# Patient Record
Sex: Male | Born: 1990 | Race: White | Hispanic: No | Marital: Single | Smoking: Never smoker
Health system: Southern US, Community
[De-identification: ages and names within clinical notes are randomized; demographics above are authoritative.]

## PROBLEM LIST (undated history)

## (undated) DIAGNOSIS — J45909 Unspecified asthma, uncomplicated: Secondary | ICD-10-CM

## (undated) HISTORY — PX: WISDOM TOOTH EXTRACTION: SHX21

## (undated) HISTORY — DX: Unspecified asthma, uncomplicated: J45.909

---
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
## 2010-04-28 ENCOUNTER — Observation Stay (HOSPITAL_COMMUNITY): Admission: EM | Admit: 2010-04-28 | Discharge: 2010-04-29 | Payer: Self-pay | Admitting: Emergency Medicine

## 2010-10-13 LAB — BASIC METABOLIC PANEL
BUN: 7 mg/dL (ref 6–23)
CO2: 22 mEq/L (ref 19–32)
CO2: 25 mEq/L (ref 19–32)
Chloride: 105 mEq/L (ref 96–112)
Chloride: 108 mEq/L (ref 96–112)
GFR calc Af Amer: >60 mL/min (ref >60)
Potassium: 2.9 mEq/L — ABNORMAL LOW (ref 3.5–5.1)
Potassium: 4.3 mEq/L (ref 3.5–5.1)

## 2010-10-13 LAB — URINALYSIS, ROUTINE W REFLEX MICROSCOPIC
Glucose, UA: NEGATIVE mg/dL
Hgb urine dipstick: NEGATIVE
Ketones, ur: NEGATIVE mg/dL
pH: 6 (ref 5.0–8.0)

## 2010-10-13 LAB — DIFFERENTIAL
Eosinophils Relative: 2 % (ref 0–5)
Lymphocytes Relative: 36 % (ref 12–46)
Lymphs Abs: 3.9 10*3/uL (ref 0.7–4.0)
Monocytes Absolute: 0.6 10*3/uL (ref 0.1–1.0)
Monocytes Relative: 6 % (ref 3–12)

## 2010-10-13 LAB — CBC
HCT: 40.4 % (ref 39.0–52.0)
HCT: 42.3 % (ref 39.0–52.0)
Hemoglobin: 14.6 g/dL (ref 13.0–17.0)
MCH: 30.5 pg (ref 26.0–34.0)
MCV: 87.6 fL (ref 78.0–100.0)
MCV: 88.5 fL (ref 78.0–100.0)
Platelets: 235 10*3/uL (ref 150–400)
RBC: 4.61 MIL/uL (ref 4.22–5.81)
RBC: 4.78 MIL/uL (ref 4.22–5.81)
RDW: 12.1 % (ref 11.5–15.5)
WBC: 11.1 10*3/uL — ABNORMAL HIGH (ref 4.0–10.5)
WBC: 15.5 10*3/uL — ABNORMAL HIGH (ref 4.0–10.5)

## 2010-10-13 LAB — ETHANOL: Alcohol, Ethyl (B): 66 mg/dL — ABNORMAL HIGH (ref 0–10)

## 2011-02-02 IMAGING — CT CT EXTREM LOW W/O CM*R*
3 of 4 series · 15 of 35 positions shown, 19 images · non-contrast
Comparison: Plain films.

CLINICAL DATA: Level II trauma.  Leg pain.

CT RIGHT KNEE WITHOUT CONTRAST
TECHNIQUE: Multidetector CT imaging of the right knee was
performed according to the standard protocol without intravenous
contrast. Multiplanar CT image reconstructions were also generated.

[Series 6: extremityknee 2.0 b40s · axial · 0.34mm/px · z∈[+368,+614]mm · 9 of 147 slices shown, 12 images]
[im 12/147  soft-tissue]
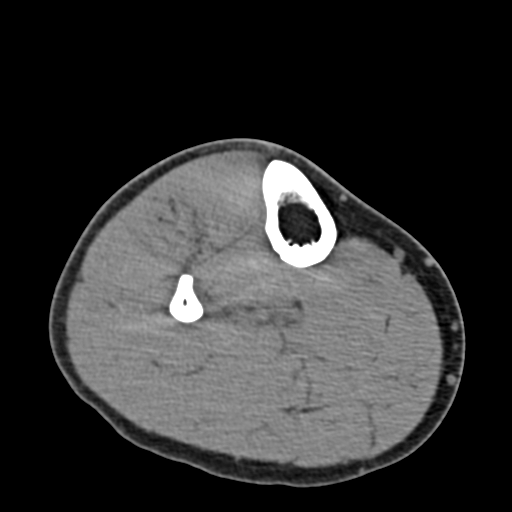
[im 12/147  bone]
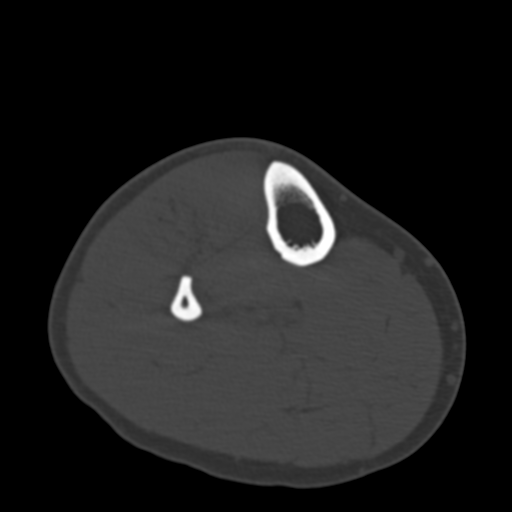
[im 34/147  bone]
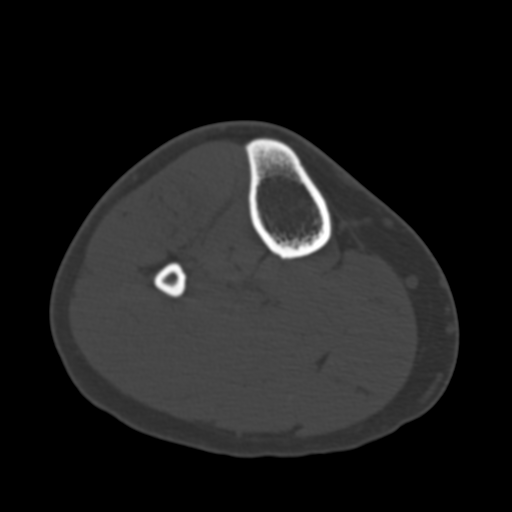
[im 45/147  bone]
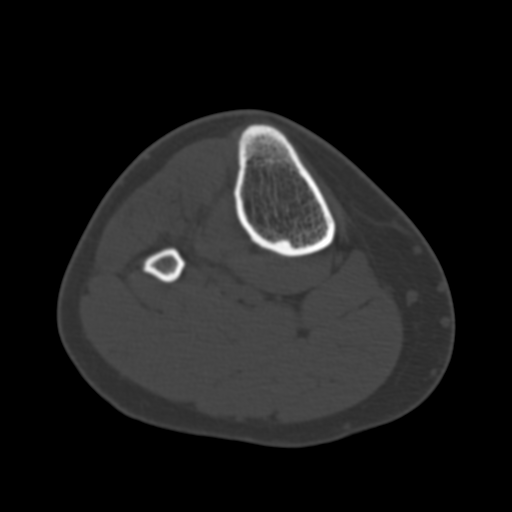
[im 57/147  bone]
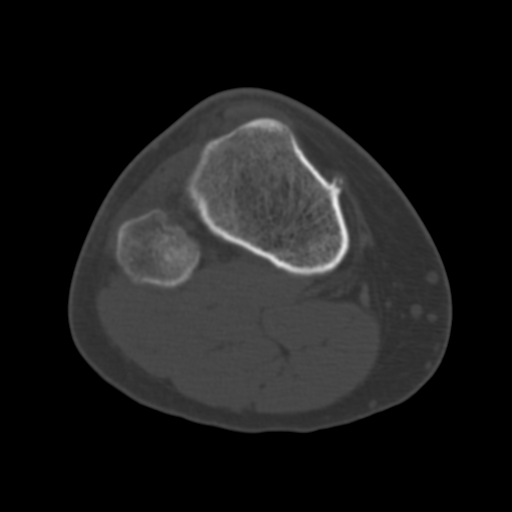
[im 79/147  soft-tissue]
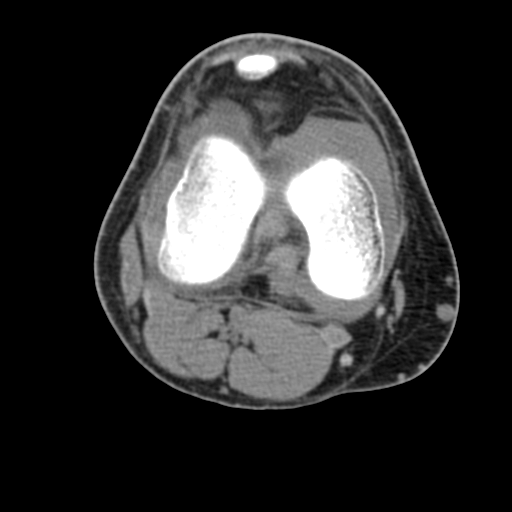
[im 79/147  bone]
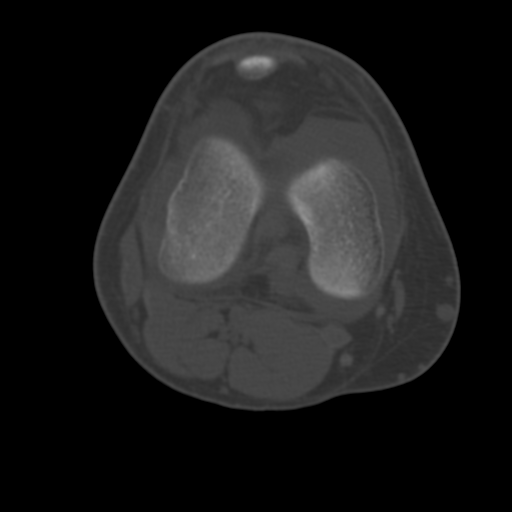
[im 90/147  bone]
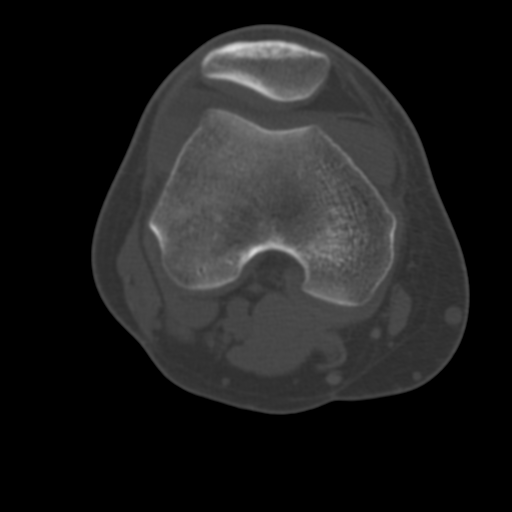
[im 102/147  bone]
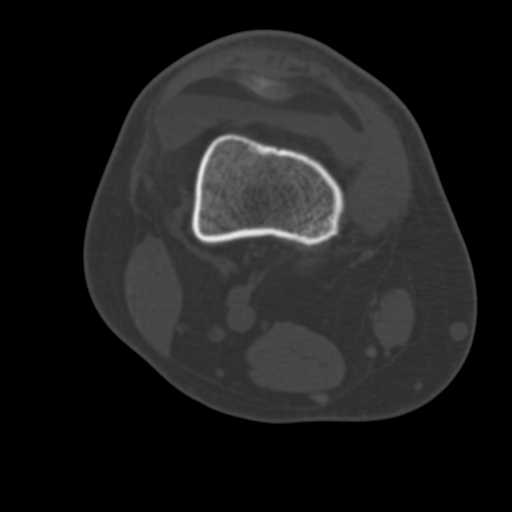
[im 124/147  bone]
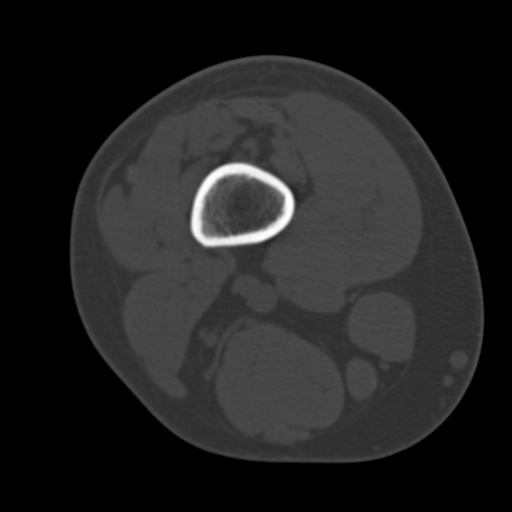
[im 135/147  soft-tissue]
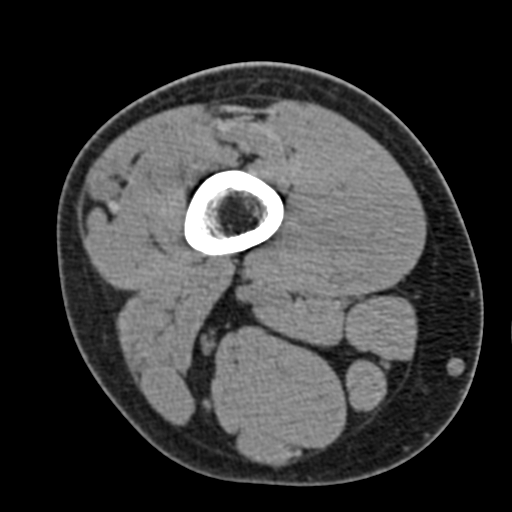
[im 135/147  bone]
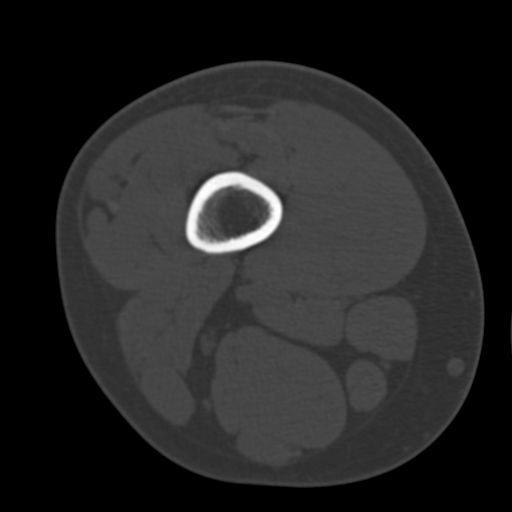

[Series 603: cor bn · coronal · 0.57mm/px · 1 of 41 slices shown]
[im 21/41  bone]
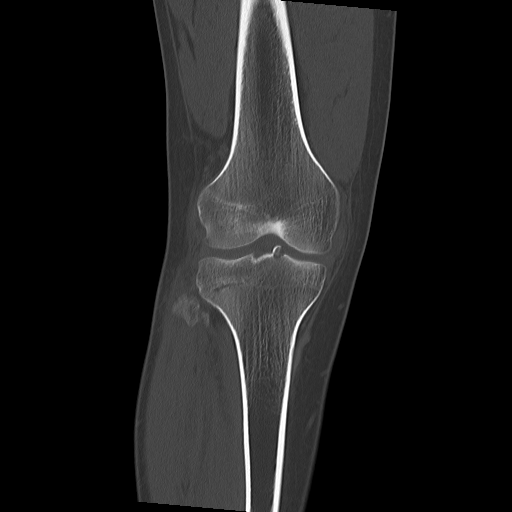

[Series 604: sag st · sagittal · 0.57mm/px · 5 of 50 slices shown, 6 images]
[im 17/50  bone]
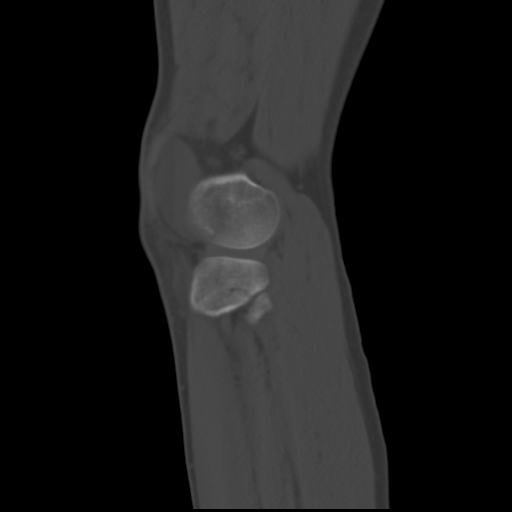
[im 21/50  bone]
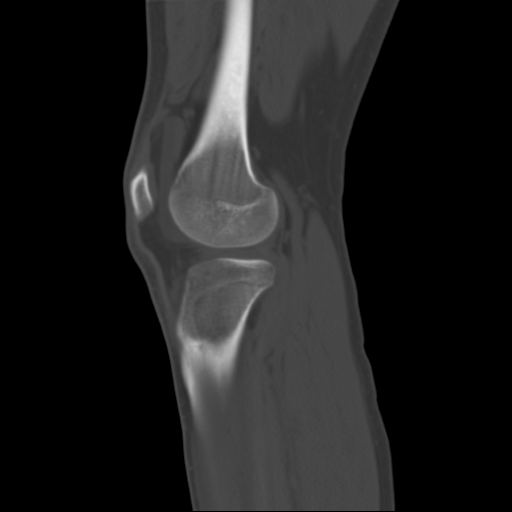
[im 25/50  soft-tissue]
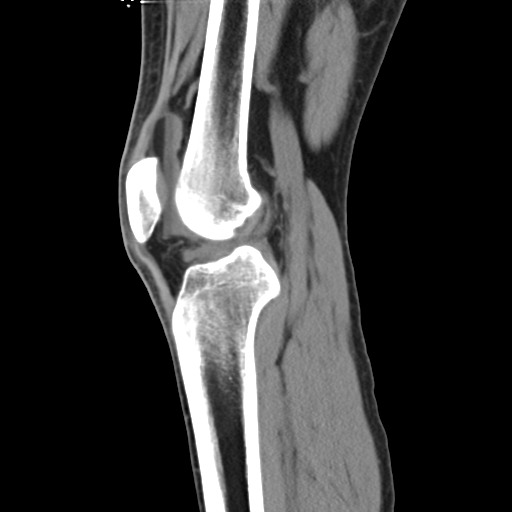
[im 25/50  bone]
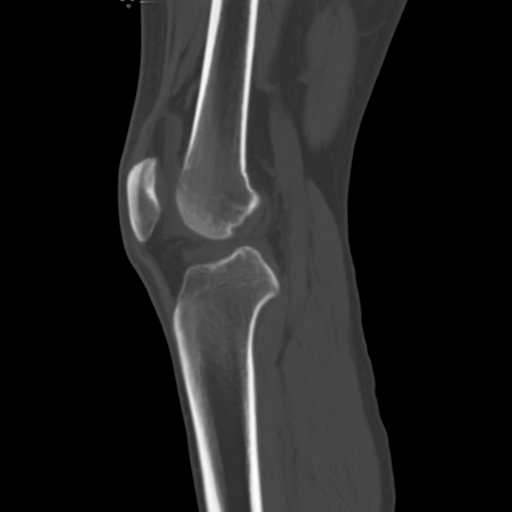
[im 29/50  bone]
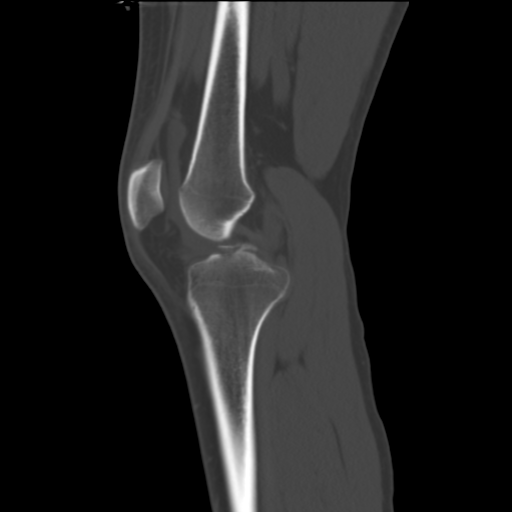
[im 33/50  bone]
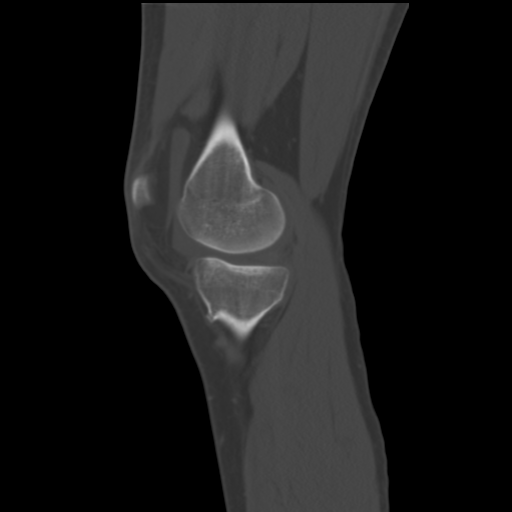

[15 of 35 positions shown; findings below may reference images not displayed]

FINDINGS: There is a nondisplaced oblique fracture through the
lateral tibial metaphysis.  This extends diagonally from the
inferior plateau to the medial tibial spine.  Avulsion fracture of
the medial tibial spine associated with the attachment of the ACL
on the tibial plateau.  There is a tiny fibular head avulsion
fracture at the insertion of the conjoined tendon.  The previously
seen incomplete proximal fibular shaft fracture is again
identified.  Lipohemarthrosis of the knee.  The femoral condyles
appear within normal limits. Small osteochondroma is present off
the medial tibial metaphysis. Grossly, the orientation of the ACL
is preserved.  PCL appears normal by CT.
IMPRESSION: 1.  Nondisplaced oblique lateral tibial plateau fracture extending
into the medial tibial spine.  Avulsion of the medial tibial spine.
Fracture is unusual in that the oblique fracture plane does not
extend into the lateral tibial condyle and there is no articular
surface disruption or depression.
2.  Avulsion fracture of the fibular head at the insertion of the
conjoined tendon.
3.  Nondisplaced incomplete fracture through the proximal fibular
diaphysis.

## 2012-08-10 ENCOUNTER — Ambulatory Visit (INDEPENDENT_AMBULATORY_CARE_PROVIDER_SITE_OTHER): Payer: BC Managed Care – PPO | Admitting: Physician Assistant

## 2012-08-10 VITALS — BP 116/72 | HR 72 | Temp 98.2°F | Resp 16 | Ht 71.0 in | Wt 195.0 lb

## 2012-08-10 DIAGNOSIS — Z23 Encounter for immunization: Secondary | ICD-10-CM

## 2012-08-10 NOTE — Progress Notes (Signed)
  Subjective:    Patient ID: Wayne Carson, male    DOB: [birth year], 22 y.o.   MRN: [id number]  HPI This 22 y.o. male presents for Tdap required for school.  Last Td 2003.   Past Medical History  Diagnosis Date  . Asthma     Past Surgical History  Procedure Date  . Eye surgery 1998    eyelid-treatment of stye    Prior to Admission medications   Medication Sig Start Date End Date Taking? Authorizing Provider  acetaminophen (TYLENOL) 325 MG tablet Take 650 mg by mouth every 6 (six) hours as needed.   Yes Historical Provider, MD    No Known Allergies  History   Social History  . Marital Status: Single    Spouse Name: n/a    Number of Children: 0  . Years of Education: N/A   Occupational History  . Sales associate     [employer]  . Student     [employer]   Social History Main Topics  . Smoking status: Never Smoker   . Smokeless tobacco: Never Used  . Alcohol Use: 0.0 - 2.0 oz/week    0-4 drink(s) per week  . Drug Use: No  . Sexually Active: Yes -- Male partner(s)    Birth Control/ Protection: Condom   Other Topics Concern  . Not on file   Social History Narrative   Lives alone with his dog.  Plans to go to [employer]in[employer] School    Family History  Problem Relation Age of Onset  . Hypertension Maternal Grandmother   . Diabetes Paternal Grandfather   . Stroke Paternal Grandfather    Review of Systems Recent GI illness, resolved.  Requests a flu vaccine while he's here.    Objective:   Physical Exam BP 116/72  Pulse 72  Temp 98.2 F (36.8 C) (Oral)  Resp 16  Ht 5\' 11"  (1.803 m)  Wt 195 lb (88.451 kg)  BMI 27.20 kg/m2  SpO2 99% WDWNWM A&O x 3. [place]/AT, sclera and conjunctiva are clear.  Heart RRR, Lungs CTA.      Assessment & Plan:   1. Need for Tdap vaccination  Tdap vaccine greater than or equal to 7yo IM  2. Need for influenza vaccination  Flu vaccine greater than or equal to 3yo preservative free IM

## 2012-08-13 ENCOUNTER — Telehealth: Payer: Self-pay

## 2012-08-13 NOTE — Telephone Encounter (Signed)
Please fax immunizations from Saturday 08/10/2012 to :   [phone]     Attn:  Immunization Department   [employer]

## 2012-08-13 NOTE — Telephone Encounter (Signed)
faxed

## 2012-08-25 ENCOUNTER — Ambulatory Visit (INDEPENDENT_AMBULATORY_CARE_PROVIDER_SITE_OTHER): Payer: BC Managed Care – PPO | Admitting: Physician Assistant

## 2012-08-25 VITALS — BP 115/68 | HR 69 | Temp 98.6°F | Resp 18 | Ht 71.0 in | Wt 196.0 lb

## 2012-08-25 DIAGNOSIS — K649 Unspecified hemorrhoids: Secondary | ICD-10-CM

## 2012-08-25 NOTE — Patient Instructions (Addendum)
Use Colace a stool softener if you start to get constipated in the future.   Constipation, Adult Constipation is when a person has fewer than 3 bowel movements a week; has difficulty having a bowel movement; or has stools that are dry, hard, or larger than normal. As people grow older, constipation is more common. If you try to fix constipation with medicines that make you have a bowel movement (laxatives), the problem may get worse. Long-term laxative use may cause the muscles of the colon to become weak. A low-fiber diet, not taking in enough fluids, and taking certain medicines may make constipation worse. CAUSES   Certain medicines, such as antidepressants, pain medicine, iron supplements, antacids, and water pills.   Certain diseases, such as diabetes, irritable bowel syndrome (IBS), thyroid disease, or depression.   Not drinking enough water.   Not eating enough fiber-rich foods.   Stress or travel.  Lack of physical activity or exercise.  Not going to the restroom when there is the urge to have a bowel movement.  Ignoring the urge to have a bowel movement.  Using laxatives too much. SYMPTOMS   Having fewer than 3 bowel movements a week.   Straining to have a bowel movement.   Having hard, dry, or larger than normal stools.   Feeling full or bloated.   Pain in the lower abdomen.  Not feeling relief after having a bowel movement. DIAGNOSIS  Your caregiver will take a medical history and perform a physical exam. Further testing may be done for severe constipation. Some tests may include:   A barium enema X-ray to examine your rectum, colon, and sometimes, your small intestine.  A sigmoidoscopy to examine your lower colon.  A colonoscopy to examine your entire colon. TREATMENT  Treatment will depend on the severity of your constipation and what is causing it. Some dietary treatments include drinking more fluids and eating more fiber-rich foods. Lifestyle  treatments may include regular exercise. If these diet and lifestyle recommendations do not help, your caregiver may recommend taking over-the-counter laxative medicines to help you have bowel movements. Prescription medicines may be prescribed if over-the-counter medicines do not work.  HOME CARE INSTRUCTIONS   Increase dietary fiber in your diet, such as fruits, vegetables, whole grains, and beans. Limit high-fat and processed sugars in your diet, such as [place] fries, hamburgers, cookies, candies, and soda.   A fiber supplement may be added to your diet if you cannot get enough fiber from foods.   Drink enough fluids to keep your urine clear or pale yellow.   Exercise regularly or as directed by your caregiver.   Go to the restroom when you have the urge to go. Do not hold it.  Only take medicines as directed by your caregiver. Do not take other medicines for constipation without talking to your caregiver first. SEEK IMMEDIATE MEDICAL CARE IF:   You have bright red blood in your stool.   Your constipation lasts for more than 4 days or gets worse.   You have abdominal or rectal pain.   You have thin, pencil-like stools.  You have unexplained weight loss. MAKE SURE YOU:   Understand these instructions.  Will watch your condition.  Will get help right away if you are not doing well or get worse. Document Released: 04/14/2004 Document Revised: 10/09/2011 Document Reviewed: 06/20/2011 [facility] Patient Information 2013 [place], [place].

## 2012-08-25 NOTE — Progress Notes (Signed)
   [address] [place] [address]   Phone [phone]   Subjective:    Patient ID: Wayne Carson, male    DOB: [birth year], 22 y.o.   MRN: [id number]  HPI  Pt presents to clinic with 1 episode of bright red blood on toilet tissue this am after going to the bathroom.  No change in stool pattern, consistency or size.  He has been to the bathroom since them without any problems or blood.  He did have some constipation about 10 days ago but feels like things are back to normal.  Review of Systems  Gastrointestinal: Positive for constipation (about 10 d ago) and anal bleeding. Negative for nausea, abdominal pain, blood in stool and rectal pain.       Objective:   Physical Exam  Vitals reviewed. Constitutional: He appears well-developed and well-nourished.  HENT:  Head: Normocephalic and atraumatic.  Right Ear: External ear normal.  Left Ear: External ear normal.  Eyes: Conjunctivae normal are normal.  Pulmonary/Chest: Effort normal.  Abdominal: Soft. Bowel sounds are normal.  Genitourinary: Rectal exam shows external hemorrhoid (small ). Rectal exam shows no fissure.          Assessment & Plan:   1. Bleeding hemorrhoid    I think that the patients constipation from last week created a hemorrhoid and now it is bleeding.  Pt to do symptomatic care to prevent constipation.  Push fluids.  Answered questions.

## 2012-11-16 ENCOUNTER — Ambulatory Visit (INDEPENDENT_AMBULATORY_CARE_PROVIDER_SITE_OTHER): Payer: BC Managed Care – PPO | Admitting: Physician Assistant

## 2012-11-16 VITALS — BP 125/73 | HR 81 | Temp 97.9°F | Resp 16 | Ht 70.5 in | Wt 196.8 lb

## 2012-11-16 DIAGNOSIS — H6982 Other specified disorders of Eustachian tube, left ear: Secondary | ICD-10-CM

## 2012-11-16 DIAGNOSIS — H6980 Other specified disorders of Eustachian tube, unspecified ear: Secondary | ICD-10-CM

## 2012-11-16 MED ORDER — IPRATROPIUM BROMIDE 0.03 % NA SOLN: 2.0000 | Freq: Two times a day (BID) | NASAL | Status: AC

## 2012-11-16 NOTE — Progress Notes (Signed)
  Subjective:    Patient ID: Wayne Carson, male    DOB: 1991-01-28, 22 y.o.   MRN: [id number]  HPI This 22 y.o. male presents for evaluation of "clogged ear."  He believes that there is wax blocking the LEFT ear.  He's tried several OTC products(rubbing alcohol, H2O2, swimmer's ear product) to clean it out without benefit.  Describes the sensation that the ear is full, like it needs to pop.  No fever, chills, nausea, vomiting.  Some mild nasal congestion recently.  No cough. No tinnitus.  Current allergies and medications reviewed.  Past medical, social and family histories reviewed.     Review of Systems As above.    Objective:   Physical Exam  Vitals reviewed. Constitutional: Vital signs are normal. He appears well-developed and well-nourished. He is active and cooperative. No distress.  HENT:  Head: Normocephalic and atraumatic.  Right Ear: Hearing, tympanic membrane, external ear and ear canal normal.  Left Ear: Hearing, tympanic membrane, external ear and ear canal normal.  Nose: Nose normal.  Mouth/Throat: Oropharynx is clear and moist. No oropharyngeal exudate.  Eyes: Conjunctivae are normal. Right eye exhibits no discharge. Left eye exhibits no discharge. No scleral icterus.  Neck: Normal range of motion. Neck supple.  Cardiovascular: Normal rate.   Pulmonary/Chest: Effort normal.       Assessment & Plan:  ETD (eustachian tube dysfunction), left - Plan: ipratropium (ATROVENT) 0.03 % nasal spray Supportive care, anticipatory guidance provided.  Fernande Bras, PA-C Physician Assistant-Certified [facility]

## 2020-02-11 ENCOUNTER — Other Ambulatory Visit: Payer: Self-pay

## 2020-02-11 ENCOUNTER — Encounter (INDEPENDENT_AMBULATORY_CARE_PROVIDER_SITE_OTHER): Payer: Self-pay | Admitting: Otolaryngology

## 2020-02-11 ENCOUNTER — Ambulatory Visit (INDEPENDENT_AMBULATORY_CARE_PROVIDER_SITE_OTHER): Payer: Managed Care, Other (non HMO) | Admitting: Otolaryngology

## 2020-02-11 VITALS — Temp 97.3°F

## 2020-02-11 DIAGNOSIS — H66011 Acute suppurative otitis media with spontaneous rupture of ear drum, right ear: Secondary | ICD-10-CM

## 2020-02-11 DIAGNOSIS — H60311 Diffuse otitis externa, right ear: Secondary | ICD-10-CM

## 2020-02-11 DIAGNOSIS — H6123 Impacted cerumen, bilateral: Secondary | ICD-10-CM | POA: Diagnosis not present

## 2020-02-11 NOTE — Progress Notes (Signed)
HPI: Wayne Carson is a 29 y.o. male who returns today for evaluation of right ear infection.  Patient has been having some pain and drainage from the right ear for the past 5 days.  Where he works he goes into "pressurized" rooms and wants to know if this will be a problem.  He apparently had a tube placed in the right ear by myself over a year ago.  Past Medical History:  Diagnosis Date  . Asthma    Past Surgical History:  Procedure Laterality Date  . EYE SURGERY  1998   eyelid-treatment of stye  . WISDOM TOOTH EXTRACTION     Social History   Socioeconomic History  . Marital status: Single    Spouse name: n/a  . Number of children: 0  . Years of education: Not on file  . Highest education level: Not on file  Occupational History  . Occupation: IT sales professional    Comment: [employer]  . Occupation: Student    Comment: [employer]  Tobacco Use  . Smoking status: Never Smoker  . Smokeless tobacco: Never Used  Substance and Sexual Activity  . Alcohol use: Yes    Alcohol/week: 0.0 - 4.0 standard drinks  . Drug use: No  . Sexual activity: Yes    Partners: Female    Birth control/protection: Condom  Other Topics Concern  . Not on file  Social History Narrative   Lives alone with his dog.  Plans to go to [employer].   Social Determinants of Health   Financial Resource Strain:   . Difficulty of Paying Living Expenses:   Food Insecurity:   . Worried About Programme researcher, broadcasting/film/video in the Last Year:   . Barista in the Last Year:   Transportation Needs:   . Freight forwarder (Medical):   Marland Kitchen Lack of Transportation (Non-Medical):   Physical Activity:   . Days of Exercise per Week:   . Minutes of Exercise per Session:   Stress:   . Feeling of Stress :   Social Connections:   . Frequency of Communication with Friends and Family:   . Frequency of Social Gatherings with Friends and Family:   . Attends Religious Services:   . Active Member of Clubs or  Organizations:   . Attends Banker Meetings:   Marland Kitchen Marital Status:    Family History  Problem Relation Age of Onset  . Hypertension Maternal Grandmother   . Diabetes Paternal Grandfather   . Stroke Paternal Grandfather    No Known Allergies Prior to Admission medications   Medication Sig Start Date End Date Taking? Authorizing Provider  acetaminophen (TYLENOL) 325 MG tablet Take 650 mg by mouth every 6 (six) hours as needed.    [provider]  ipratropium (ATROVENT) 0.03 % nasal spray Place 2 sprays into the nose 2 (two) times daily. 11/16/12   Porfirio Oar, PA     Positive ROS: Otherwise negative  All other systems have been reviewed and were otherwise negative with the exception of those mentioned in the HPI and as above.  Physical Exam: Constitutional: Alert, well-appearing, no acute distress Ears: External ears without lesions or tenderness.  Left ear canal with moderate cerumen that was removed with a curette with a left clear TM.Marland Kitchen  Right ear canal reveals purulent discharge which was suctioned and cleaned.  He has diffuse inflammatory changes of the ear canal and drainage from tympanic membrane either perforation or possible tube although I cannot  visualize the tube in the office. Nasal: External nose without lesions.. Clear nasal passages with moderate rhinitis no obvious mucopurulent discharge noted. Oral: Lips and gums without lesions. Tongue and palate mucosa without lesions. Posterior oropharynx clear. Neck: No palpable adenopathy or masses Respiratory: Breathing comfortably  Skin: No facial/neck lesions or rash noted.  Cerumen impaction removal  Date/Time: 02/11/2020 12:47 PM Performed by: Drema Halon, MD Authorized by: Drema Halon, MD   Consent:    Consent obtained:  Verbal   Consent given by:  Patient   Risks discussed:  Pain and bleeding Procedure details:    Location:  L ear and R ear Post-procedure details:     Hearing quality:  Improved   Patient tolerance of procedure:  Tolerated well, no immediate complications Comments:     Left ear canal had moderate cerumen that was removed with a curette.  Right ear canal had purulent discharge with inflammatory changes of the ear canal and purulent discharge from either TM perforation or myringotomy tube on the right side.  Insufflated Ciprodex and CSF powder into the right middle ear space.    Assessment: Acute right otitis media with secondary external otitis.  Plan: Placed him on Ciprodex drops 4 to 5 drops in the right ear twice daily for 1 week and Augmentin 875 mg twice daily for 10 days.  He will follow-up in 2 to 3 weeks for recheck or earlier if he has any problems.   Narda Bonds, MD

## 2020-03-18 ENCOUNTER — Ambulatory Visit: Payer: Self-pay | Attending: Internal Medicine

## 2020-03-18 DIAGNOSIS — Z23 Encounter for immunization: Secondary | ICD-10-CM

## 2020-03-18 NOTE — Progress Notes (Signed)
   Covid-19 Vaccination Clinic  Name:  Wayne Carson    MRN: [id number] DOB: 10-01-1990  03/18/2020  Wayne Carson was observed post Covid-19 immunization for 15 minutes without incident. He was provided with Vaccine Information Sheet and instruction to access the V-Safe system.   Wayne Carson was instructed to call 911 with any severe reactions post vaccine: Marland Kitchen Difficulty breathing  . Swelling of face and throat  . A fast heartbeat  . A bad rash all over body  . Dizziness and weakness   Immunizations Administered    Name Date Dose VIS Date Route   Moderna COVID-19 Vaccine 03/18/2020  4:47 PM 0.5 mL 07/2019 Intramuscular   Manufacturer: Moderna   Lot: [id number]   NDC: 35465-681-27

## 2020-04-15 ENCOUNTER — Ambulatory Visit: Payer: Self-pay

## 2022-10-18 ENCOUNTER — Emergency Department (HOSPITAL_BASED_OUTPATIENT_CLINIC_OR_DEPARTMENT_OTHER)
Admission: EM | Admit: 2022-10-18 | Discharge: 2022-10-18 | Disposition: A | Discharge status: Home / Self Care | Payer: Commercial Managed Care - PPO | Attending: Emergency Medicine | Admitting: Emergency Medicine

## 2022-10-18 ENCOUNTER — Other Ambulatory Visit: Payer: Self-pay

## 2022-10-18 ENCOUNTER — Encounter (HOSPITAL_BASED_OUTPATIENT_CLINIC_OR_DEPARTMENT_OTHER): Payer: Self-pay

## 2022-10-18 ENCOUNTER — Emergency Department (HOSPITAL_BASED_OUTPATIENT_CLINIC_OR_DEPARTMENT_OTHER): Payer: Commercial Managed Care - PPO

## 2022-10-18 DIAGNOSIS — R519 Headache, unspecified: Secondary | ICD-10-CM

## 2022-10-18 DIAGNOSIS — R030 Elevated blood-pressure reading, without diagnosis of hypertension: Secondary | ICD-10-CM

## 2022-10-18 MED ORDER — IBUPROFEN 400 MG PO TABS
400.0000 mg | ORAL_TABLET | Freq: Once | ORAL | Status: AC
  Administered 2022-10-18: 400 mg via ORAL
  Filled 2022-10-18: qty 1

## 2022-10-18 MED ORDER — ACETAMINOPHEN 500 MG PO TABS
1000.0000 mg | ORAL_TABLET | Freq: Once | ORAL | Status: AC
  Administered 2022-10-18: 1000 mg via ORAL
  Filled 2022-10-18: qty 2

## 2022-10-18 NOTE — ED Triage Notes (Signed)
Patient here POV from Home.  Endorses Awakening with Headache that began 1-2 Weeks ago. Associated with some pressure behind Right Eye when bending over. Has subsided somewhat.   Headache is localized to top and right head. Given Prednisone by UC on Sunday which has been somewhat effective.   No Photosensitivity. No N/V.   NAD Noted during Triage. A&Ox4. GCS 15. Ambulatory.

## 2022-10-18 NOTE — ED Provider Notes (Signed)
[facility] Provider Note   CSN: [id number] Arrival date & time: 10/18/22  1526     History  Chief Complaint  Patient presents with   Headache    Wayne Carson is a 32 y.o. male.  Pt c/o recurrent headaches in past two weeks. States prior to this would occasionally get a headache, but not daily or frequent. No recent head trauma, contusion, syncope or fall. Denies sinus pain, drainage or rhinorrhea. No sore throat or trouble swallowing. No ear pain or hearing loss. No eye pain or redness. No visual change or feeling of eye strain. Job has required possibly more screen time than prior. Headache, dull, mild-mod, right frontal and superior/vertex in location. No skin lesions/rash to area of pain. No recent febrile illness. Saw urgent care, tried course steroids - symptoms mildly improved but not resolved. Denies change in vision or speech. No new numbness/weakness. No problems w balance, coordination or normal functional ability. Denies occurring at specific time of day or with specific activity or position.   The history is provided by the patient and medical records.  Headache Associated symptoms: no abdominal pain, no back pain, no cough, no ear pain, no eye pain, no fever, no nausea, no neck pain, no neck stiffness, no numbness, no sore throat, no vomiting and no weakness        Home Medications Prior to Admission medications   Medication Sig Start Date End Date Taking? Authorizing Provider  acetaminophen (TYLENOL) 325 MG tablet Take 650 mg by mouth every 6 (six) hours as needed.    [provider]  ipratropium (ATROVENT) 0.03 % nasal spray Place 2 sprays into the nose 2 (two) times daily. 11/16/12   Harrison Mons, [place]      Allergies    Patient has no known allergies.    Review of Systems   Review of Systems  Constitutional:  Negative for chills and fever.  HENT:  Negative for ear pain, sinus pain and sore throat.    Eyes:  Negative for pain, redness and visual disturbance.  Respiratory:  Negative for cough and shortness of breath.   Cardiovascular:  Negative for chest pain.  Gastrointestinal:  Negative for abdominal pain, nausea and vomiting.  Genitourinary:  Negative for dysuria and flank pain.  Musculoskeletal:  Negative for back pain, neck pain and neck stiffness.  Skin:  Negative for rash.  Neurological:  Positive for headaches. Negative for speech difficulty, weakness and numbness.  Hematological:  Does not bruise/bleed easily.  Psychiatric/Behavioral:  Negative for confusion.     Physical Exam Updated Vital Signs BP (!) 159/99 (BP Location: Right Arm)   Pulse 92   Temp 98.3 F (36.8 C) (Oral)   Resp 18   Ht 1.803 m (5\' 11" )   Wt 100.7 kg   SpO2 100%   BMI 30.96 kg/m  Physical Exam Vitals and nursing note reviewed.  Constitutional:      Appearance: Normal appearance. He is well-developed.  HENT:     Head: Atraumatic.     Comments: No temporal or mastoid tenderness.    Right Ear: Tympanic membrane and ear canal normal.     Left Ear: Tympanic membrane and ear canal normal.     Nose: Nose normal.     Mouth/Throat:     Mouth: Mucous membranes are moist.     Pharynx: Oropharynx is clear.  Eyes:     General: No scleral icterus.    Extraocular Movements: Extraocular movements intact.  Conjunctiva/sclera: Conjunctivae normal.     Pupils: Pupils are equal, round, and reactive to light.  Neck:     Vascular: No carotid bruit.     Trachea: No tracheal deviation.     Comments: No neck mass. No stiffness or rigidity.  Cardiovascular:     Rate and Rhythm: Normal rate and regular rhythm.     Pulses: Normal pulses.     Heart sounds: Normal heart sounds. No murmur heard.    No friction rub. No gallop.  Pulmonary:     Effort: Pulmonary effort is normal. No accessory muscle usage or respiratory distress.     Breath sounds: Normal breath sounds.  Abdominal:     General: There is no  distension.  Musculoskeletal:        General: No swelling.     Cervical back: Normal range of motion and neck supple. No rigidity or tenderness.  Skin:    General: Skin is warm and dry.     Findings: No rash.  Neurological:     Mental Status: He is alert.     Comments: Alert, speech clear. Motor/sens grossly intact bil. Steady gait.   Psychiatric:        Mood and Affect: Mood normal.     ED Results / Procedures / Treatments   Labs (all labs ordered are listed, but only abnormal results are displayed) Labs Reviewed - No data to display  EKG None  Radiology CT Head Wo Contrast  Result Date: 10/18/2022 CLINICAL DATA:  Headache pressure behind the right eye EXAM: CT HEAD WITHOUT CONTRAST TECHNIQUE: Contiguous axial images were obtained from the base of the skull through the vertex without intravenous contrast. RADIATION DOSE REDUCTION: This exam was performed according to the departmental dose-optimization program which includes automated exposure control, adjustment of the mA and/or kV according to patient size and/or use of iterative reconstruction technique. COMPARISON:  CT brain report 04/28/2010 FINDINGS: Brain: No acute territorial infarction, hemorrhage or intracranial mass. The ventricles are nonenlarged. Vascular: No hyperdense vessels.  No unexpected calcification Skull: Normal. Negative for fracture or focal lesion. Sinuses/Orbits: No acute finding. Other: None IMPRESSION: Negative non contrasted CT appearance of the brain. Electronically Signed   By: Donavan Foil M.D.   On: 10/18/2022 17:53    Procedures Procedures    Medications Ordered in ED Medications  acetaminophen (TYLENOL) tablet 1,000 mg (has no administration in time range)  ibuprofen (ADVIL) tablet 400 mg (has no administration in time range)    ED Course/ Medical Decision Making/ A&P                             Medical Decision Making Amount and/or Complexity of Data Reviewed External Data Reviewed:  notes. Radiology: ordered and independent interpretation performed. Decision-making details documented in ED Course.  Risk OTC drugs. Prescription drug management.   Imaging ordered.   Reviewed nursing notes and prior charts for additional history.   Ct reviewed/interpreted by me - no hem.   Acetaminophen po. Ibuprofen po.   Will refer to neurology f/u.  Return precautions provided.          Final Clinical Impression(s) / ED Diagnoses Final diagnoses:  None    Rx / DC Orders ED Discharge Orders     None         Lajean Saver, MD 10/18/22 1815

## 2022-10-18 NOTE — Discharge Instructions (Addendum)
It was our pleasure to provide your ER care today - we hope that you feel better.  Your ct scan was read as looking good or normal.  Drink plenty of fluids/stay well hydrated. Take excedrin, acetaminophen or ibuprofen as need. Avoid eye strain and excessive screen time. Get adequate rest.   Follow up closely with your doctor/neurologist in one week - a referral was made - see attached office info - call to arrange appointment.   Your blood pressure is high today - follow up closely with primary care doctor in the coming week.  Return to ER if worse, new symptoms, severe head pain, numbness/weakness, change in vision or speech, fevers, persistent vomiting, or other concern.
